# Patient Record
Sex: Male | Born: 1995 | Race: Black or African American | Hispanic: No | Marital: Single
Health system: Southern US, Community
[De-identification: ages and names within clinical notes are randomized; demographics above are authoritative.]

---

## 2020-02-20 ENCOUNTER — Emergency Department (HOSPITAL_COMMUNITY): Payer: Medicaid Other

## 2020-02-20 ENCOUNTER — Emergency Department (HOSPITAL_COMMUNITY)
Admission: EM | Admit: 2020-02-20 | Discharge: 2020-02-20 | Disposition: A | Discharge status: Home / Self Care | Payer: Medicaid Other | Attending: Emergency Medicine | Admitting: Emergency Medicine

## 2020-02-20 ENCOUNTER — Other Ambulatory Visit: Payer: Self-pay

## 2020-02-20 DIAGNOSIS — M79641 Pain in right hand: Secondary | ICD-10-CM | POA: Insufficient documentation

## 2020-02-20 DIAGNOSIS — M79642 Pain in left hand: Secondary | ICD-10-CM | POA: Insufficient documentation

## 2020-02-20 MED ORDER — IBUPROFEN 800 MG PO TABS: 800.0000 mg | ORAL_TABLET | Freq: Three times a day (TID) | ORAL | 0 refills | Status: AC

## 2020-02-20 NOTE — ED Triage Notes (Signed)
Patient states he had a boxer fracture in his right hand in middle school and now it is bothering him. Patient states he has been out of work for a week and employer wants a weight restriction paper signed by MD for patient to return to work.

## 2020-02-20 NOTE — ED Notes (Signed)
After triaged, patient began listing multiple complaints and body parts that were "hurting" writer will order no further imaging and wait on MD evaluation.

## 2020-02-20 NOTE — ED Provider Notes (Signed)
[place] DEPT Provider Note   CSN: [id number] Arrival date & time: 02/20/20  1019     History Chief Complaint  Patient presents with  . Hand Injury    right    Peter Mccarty is a 24 y.o. male.  HPI Patient presents to the emergency department with pain in both hands from previous injuries.  The patient states that he has worked at this job for about a month and the other day noticed while lifting that he had pain.  Patient states the pain is over the areas where he had a fracture in his right hand and a fracture in his left hand as well.  Patient states that nothing seems make his condition better other than rest.  Patient has no other complaints. No past medical history on file.  There are no problems to display for this patient.       No family history on file.  Social History   Tobacco Use  . Smoking status: Not on file  Substance Use Topics  . Alcohol use: Not on file  . Drug use: Not on file    Home Medications Prior to Admission medications   Not on File    Allergies    Patient has no allergy information on record.  Review of Systems   Review of Systems All other systems negative except as documented in the HPI. All pertinent positives and negatives as reviewed in the HPI. Physical Exam Updated Vital Signs BP 140/77 (BP Location: Left Arm)   Pulse 77   Temp 98.4 F (36.9 C) (Oral)   Resp 16   Ht 5\' 7"  (1.702 m)   SpO2 98%   Physical Exam Vitals and nursing note reviewed.  Constitutional:      General: He is not in acute distress.    Appearance: He is well-developed.  HENT:     Head: Normocephalic and atraumatic.  Eyes:     Pupils: Pupils are equal, round, and reactive to light.  Pulmonary:     Effort: Pulmonary effort is normal.  Musculoskeletal:     Right hand: Tenderness present. No swelling or deformity. Normal strength. Normal sensation. Normal capillary refill.     Left hand: Tenderness present. No  swelling or deformity. Normal strength. Normal sensation. Normal capillary refill.  Skin:    General: Skin is warm and dry.  Neurological:     Mental Status: He is alert and oriented to person, place, and time.     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 FINDINGS: No fracture or dislocation. Tiny exostoses in the distal aspect of the proximal phalanx in the left fifth finger and at the base of the proximal phalanx in the left thumb. No significant arthropathy. No radiopaque foreign bodies. IMPRESSION: No fracture or dislocation.  Tiny exostoses as described. Electronically Signed   By: Ilona Sorrel M.D.   On: 02/20/2020 11:15   DG Hand Complete Right  Result Date: 02/20/2020 CLINICAL DATA:  Right hand pain. History of boxer fracture in 2019. EXAM: RIGHT HAND - COMPLETE 3+ VIEW COMPARISON:  None. FINDINGS: No fracture or dislocation. No suspicious focal osseous lesions. Mild healed  deformity in the right fifth metacarpal. No significant arthropathy. No radiopaque foreign bodies. IMPRESSION: No acute osseous abnormality. Mild healed deformity in the right fifth metacarpal. Electronically Signed   By: Delbert Phenix M.D.   On: 02/20/2020 11:17    Procedures Procedures (including critical care time)  Medications Ordered in ED Medications - No data to display  ED Course  I have reviewed the triage vital signs and the nursing notes.  Pertinent labs & imaging results that were available during my care of the patient were reviewed by me and considered in my medical decision making (see chart for details).    MDM Rules/Calculators/A&P                      Patient is advised to  take Tylenol and Motrin for pain.  Told to return here as needed.  Patient agrees the plan and all questions were answered. Final Clinical Impression(s) / ED Diagnoses Final diagnoses:  None    Rx / DC Orders ED Discharge Orders    None       Charlestine Night, PA-C 02/20/20 1218    Rolan Bucco, MD 02/20/20 1341

## 2020-02-20 NOTE — Discharge Instructions (Signed)
Return here as needed.  Follow-up as needed with a primary doctor or urgent care as needed.  Ice and heat to your hands.

## 2020-02-20 NOTE — ED Notes (Signed)
Pt verbalizes understanding of DC instructions. Pt belongings returned and is ambulatory out of ED.  

## 2020-03-28 ENCOUNTER — Ambulatory Visit: Payer: Medicaid Other | Admitting: Family Medicine

## 2021-09-01 IMAGING — CR DG HAND COMPLETE 3+V*R*
3 series · 3 of 3 positions shown · non-contrast
Comparison: None.

CLINICAL DATA: Right hand pain. History of boxer fracture in 2898.

EXAM:
RIGHT HAND - COMPLETE 3+ VIEW

[x hand pa right]
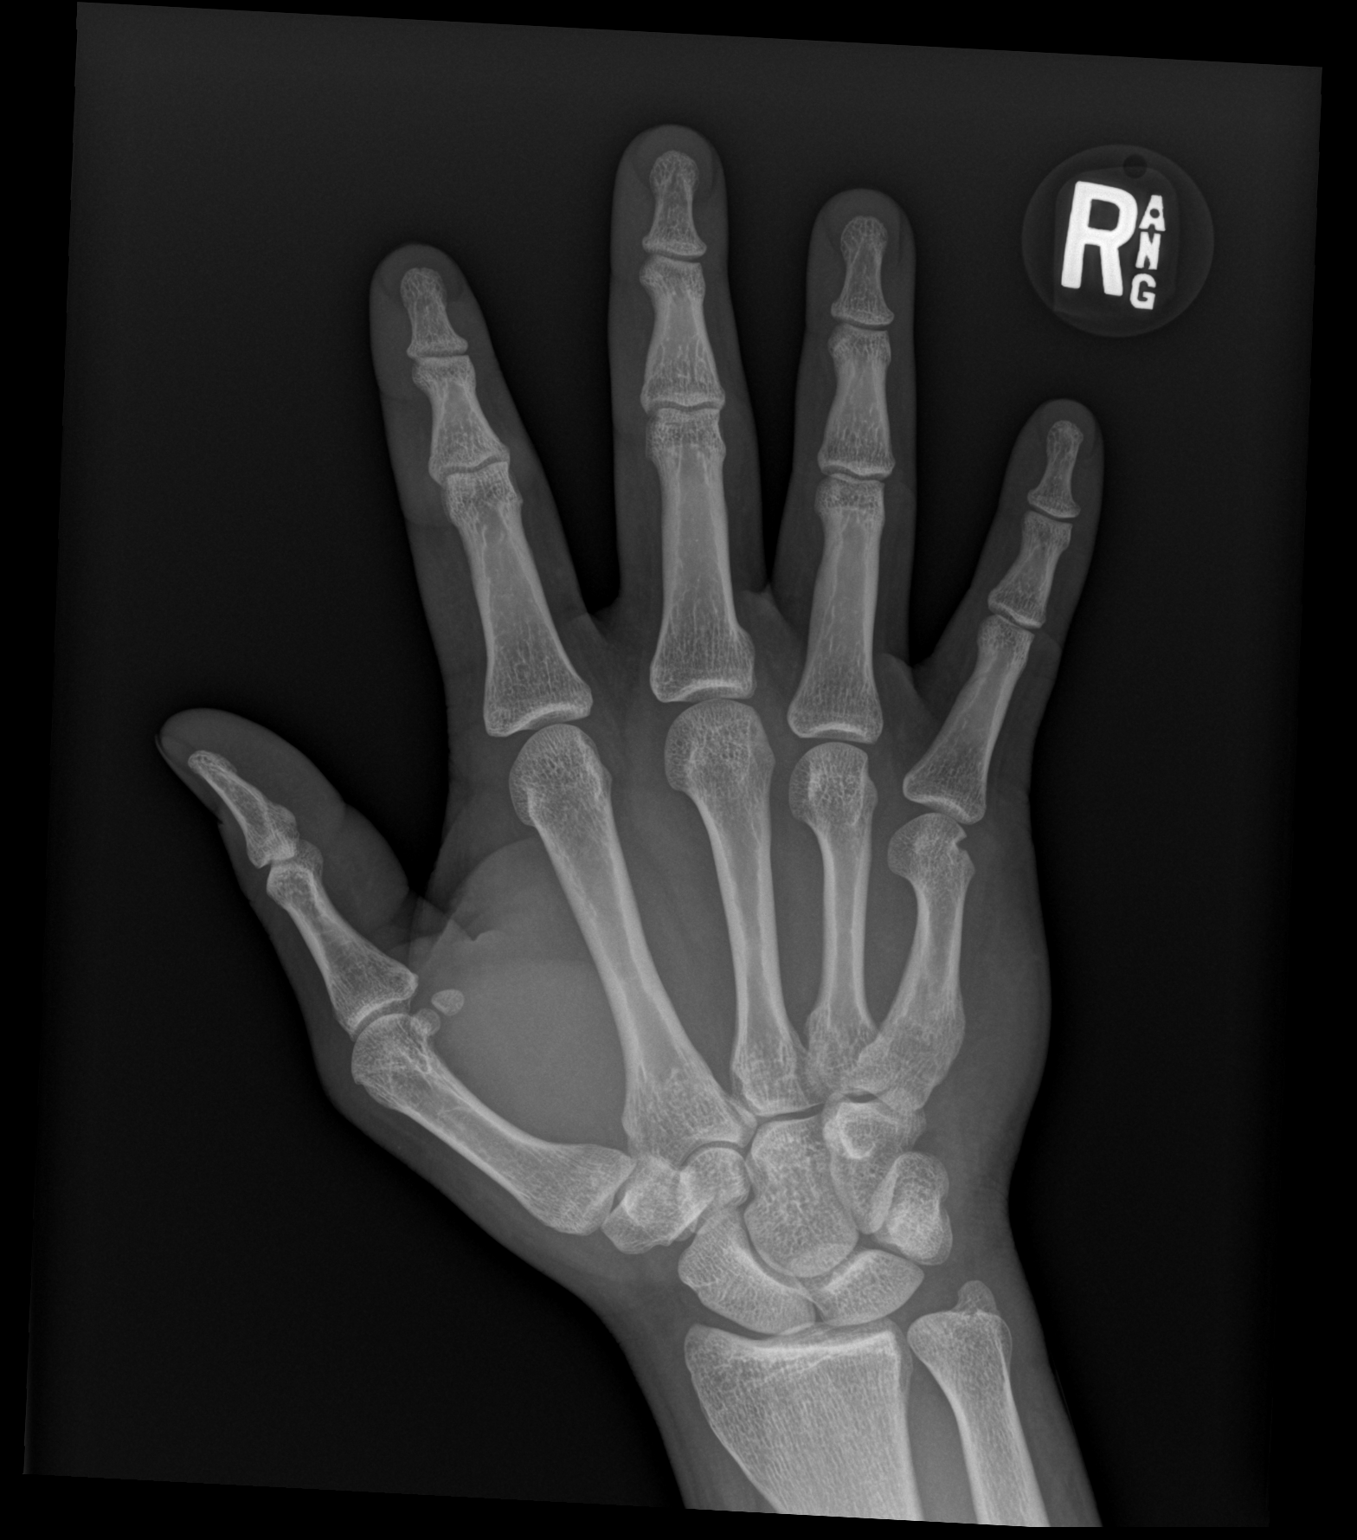

[x hand obl right]
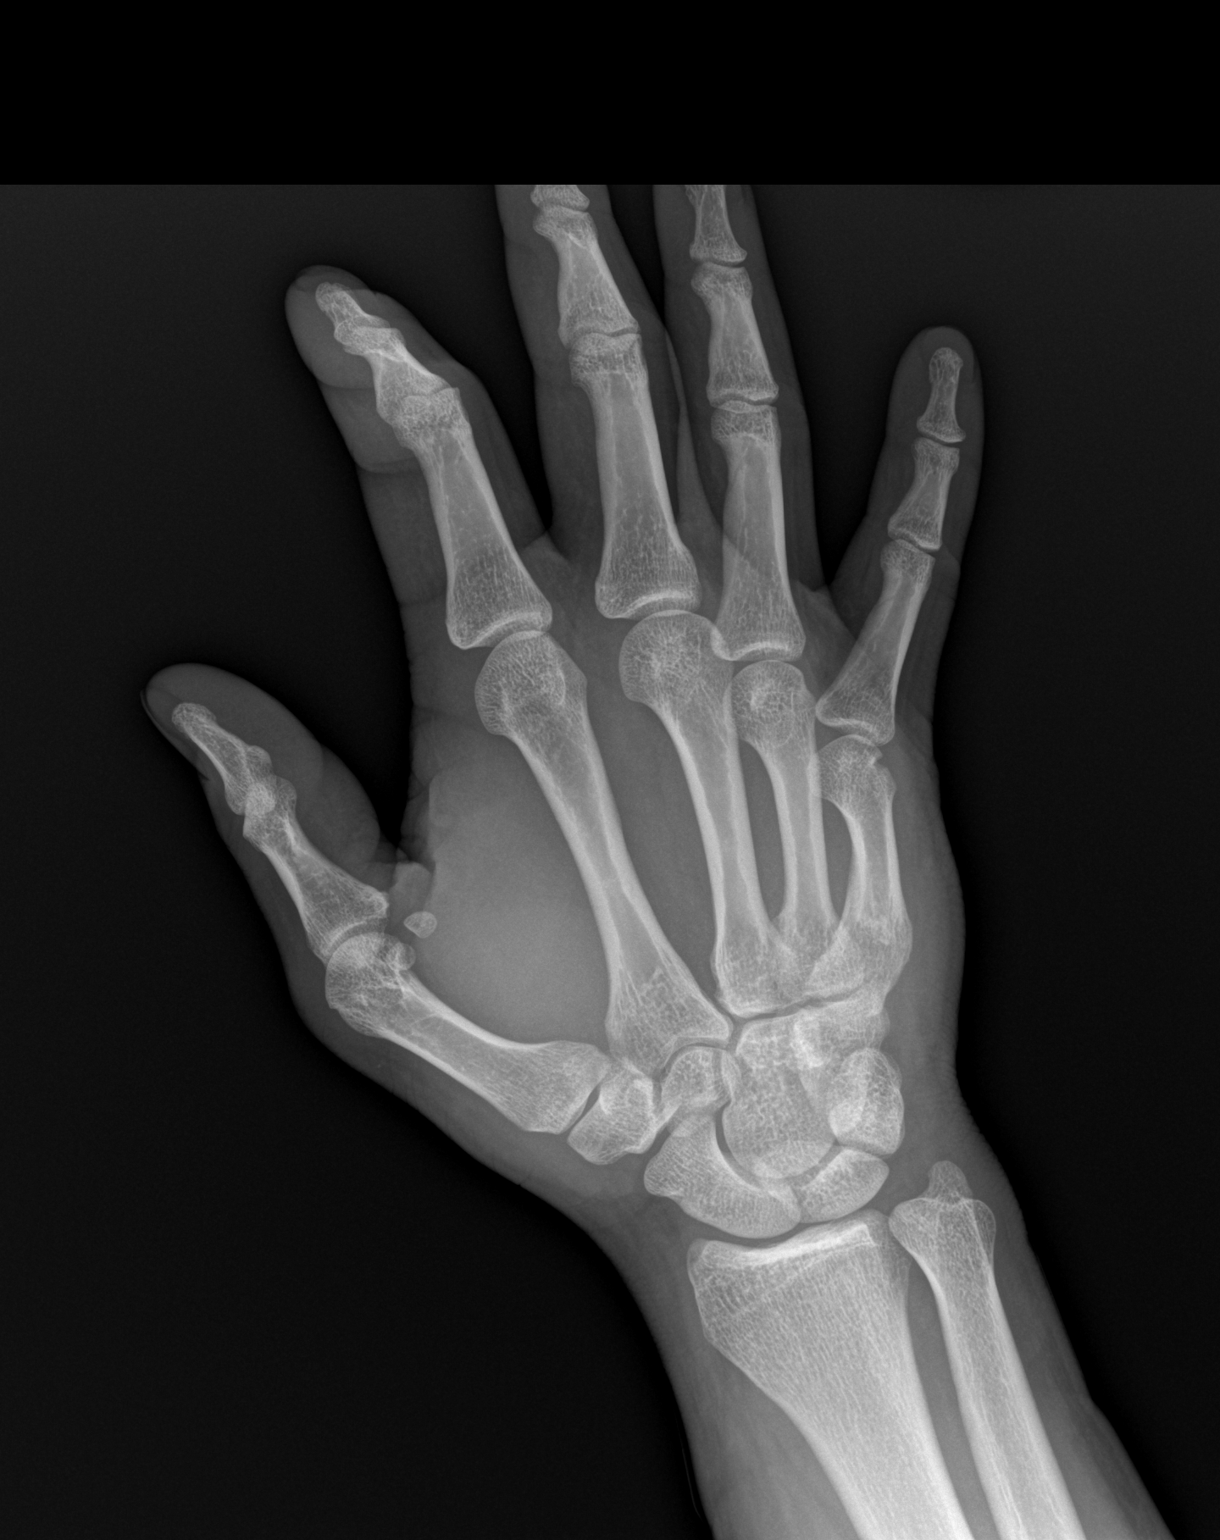

[x hand lat right]
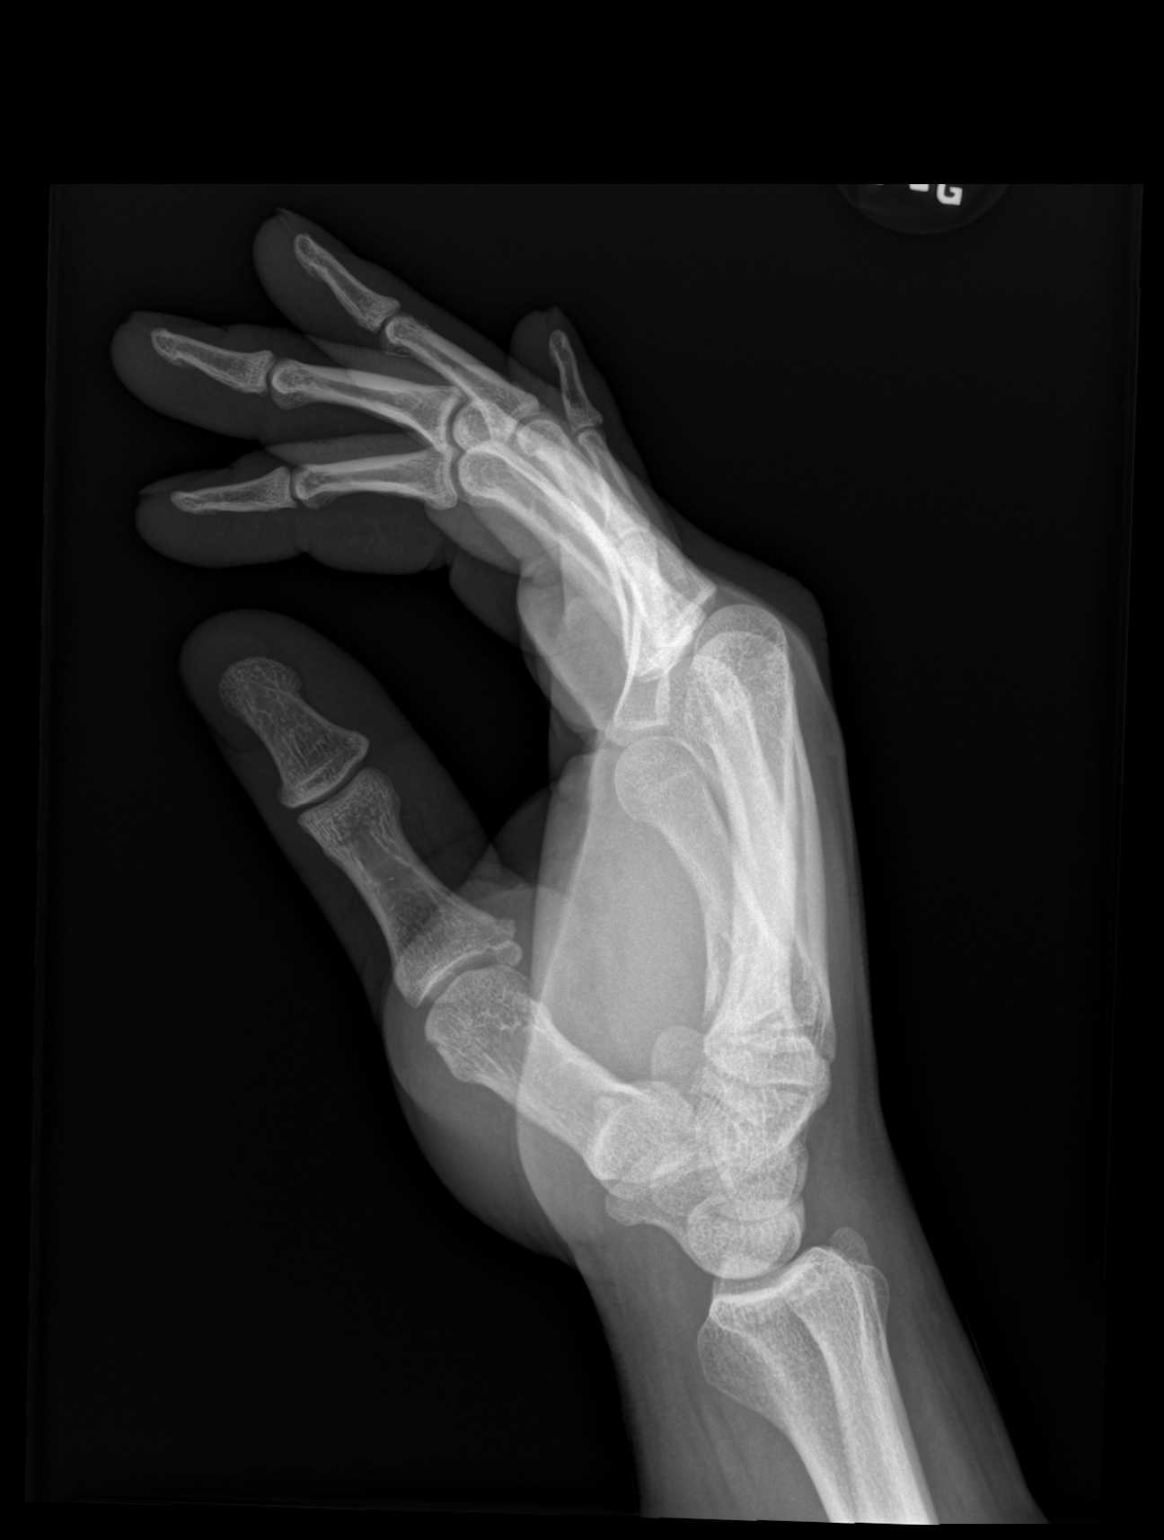

[3 of 3 positions shown; findings below may reference images not displayed]

FINDINGS: No fracture or dislocation. No suspicious focal osseous lesions.
Mild healed deformity in the right fifth metacarpal. No significant
arthropathy. No radiopaque foreign bodies.
IMPRESSION: No acute osseous abnormality. Mild healed deformity in the right
fifth metacarpal.

## 2024-08-10 DEATH — deceased
